# Patient Record
Sex: Female | Born: 1955 | Race: White | Hispanic: No | Marital: Married | Smoking: Never smoker
Health system: Southern US, Community
[De-identification: ages and names within clinical notes are randomized; demographics above are authoritative.]

## PROBLEM LIST (undated history)

## (undated) ENCOUNTER — Ambulatory Visit: Discharge status: Absent without leave | Payer: BLUE CROSS/BLUE SHIELD

## (undated) DIAGNOSIS — F329 Major depressive disorder, single episode, unspecified: Secondary | ICD-10-CM

## (undated) DIAGNOSIS — E079 Disorder of thyroid, unspecified: Secondary | ICD-10-CM

## (undated) DIAGNOSIS — I10 Essential (primary) hypertension: Secondary | ICD-10-CM

## (undated) DIAGNOSIS — F32A Depression, unspecified: Secondary | ICD-10-CM

## (undated) HISTORY — PX: CHOLECYSTECTOMY: SHX55

---

## 2002-08-05 ENCOUNTER — Ambulatory Visit (HOSPITAL_BASED_OUTPATIENT_CLINIC_OR_DEPARTMENT_OTHER): Admission: RE | Admit: 2002-08-05 | Discharge: 2002-08-05 | Payer: Self-pay | Admitting: Neurology

## 2005-09-12 ENCOUNTER — Ambulatory Visit: Payer: Self-pay

## 2006-10-11 ENCOUNTER — Ambulatory Visit: Payer: Self-pay

## 2007-06-23 ENCOUNTER — Ambulatory Visit: Payer: Self-pay | Admitting: Family Medicine

## 2007-10-18 ENCOUNTER — Ambulatory Visit: Payer: Self-pay

## 2008-11-09 ENCOUNTER — Inpatient Hospital Stay: Payer: Self-pay | Admitting: Vascular Surgery

## 2008-11-20 ENCOUNTER — Other Ambulatory Visit: Payer: Self-pay | Admitting: Internal Medicine

## 2008-11-21 ENCOUNTER — Ambulatory Visit: Payer: Self-pay | Admitting: Vascular Surgery

## 2009-04-07 ENCOUNTER — Ambulatory Visit: Payer: Self-pay | Admitting: Internal Medicine

## 2010-01-16 IMAGING — CT CT ABD-PELV W/ CM
1 of 2 series · 14 of 32 positions shown, 18 images · non-contrast
Comparison: none

REASON FOR EXAM: [id number] PO contrast also  RUQ abd pain s/p lap chole
EVAL  retroperitoneal ...
COMMENTS:

[Series 2: abdomen · axial · 0.78mm/px · z∈[-541,-121]mm · 14 of 93 slices shown, 18 images]
[im 5/93  soft-tissue]
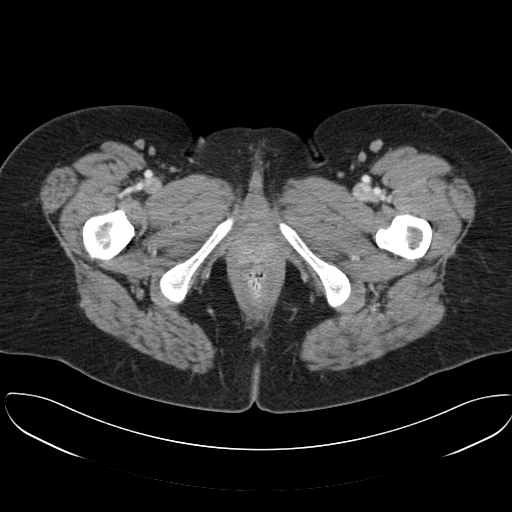
[im 5/93  bone]
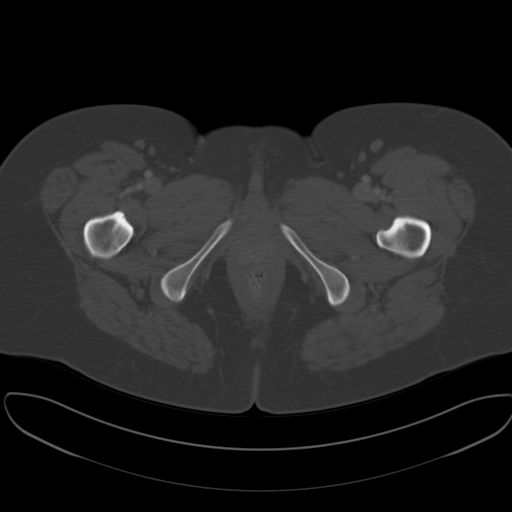
[im 13/93  soft-tissue]
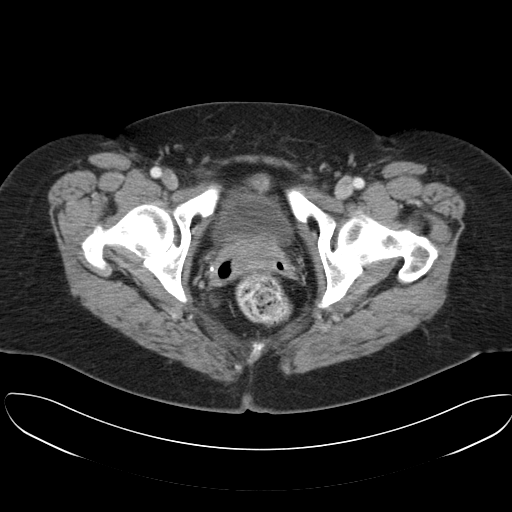
[im 21/93  soft-tissue]
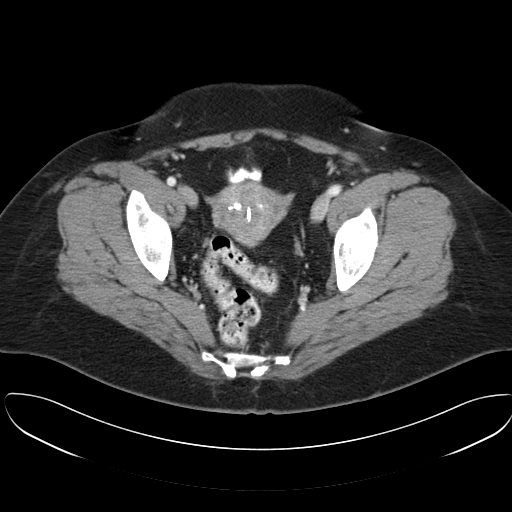
[im 29/93  soft-tissue]
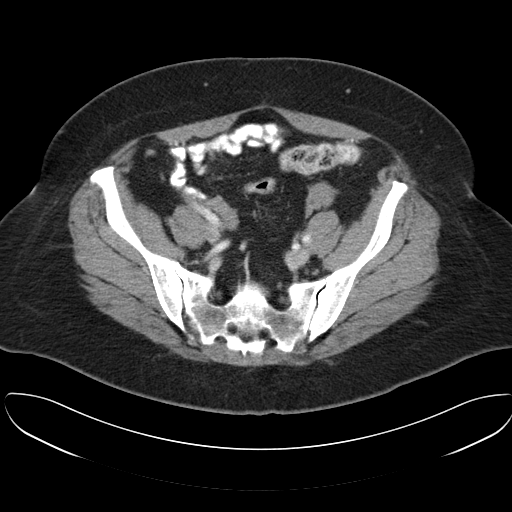
[im 37/93  soft-tissue]
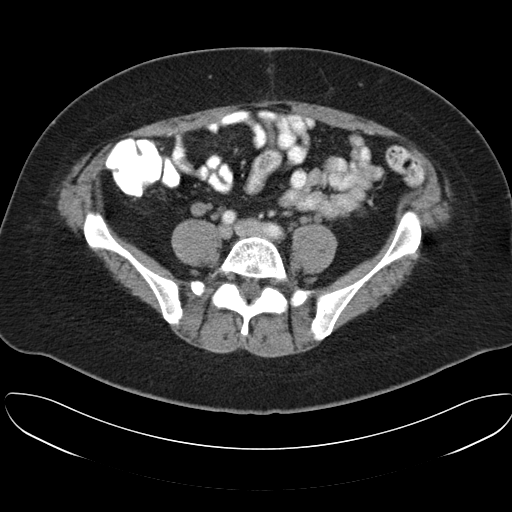
[im 45/93  soft-tissue]
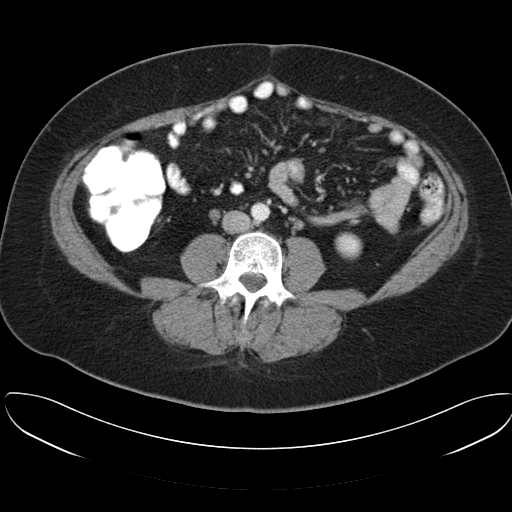
[im 49/93  soft-tissue]
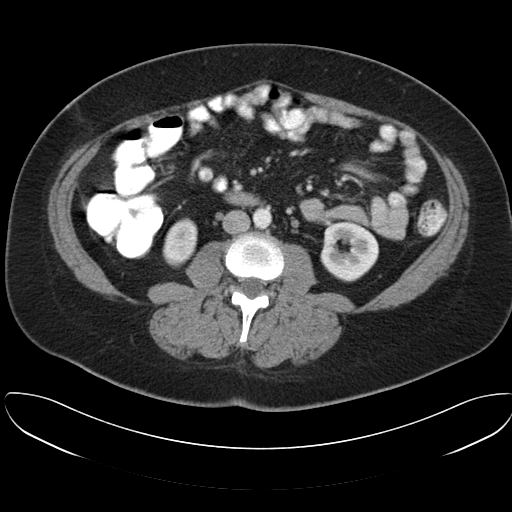
[im 57/93  soft-tissue]
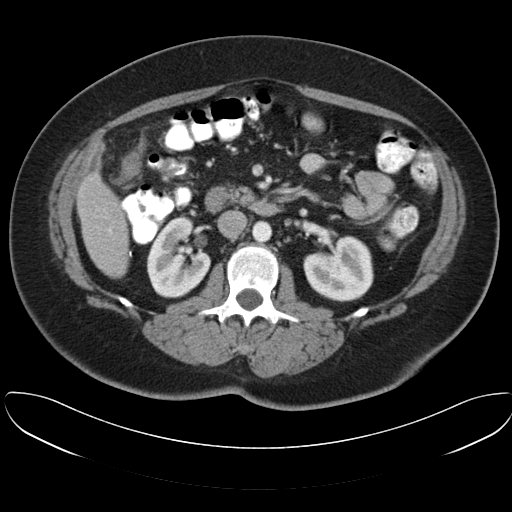
[im 65/93  soft-tissue]
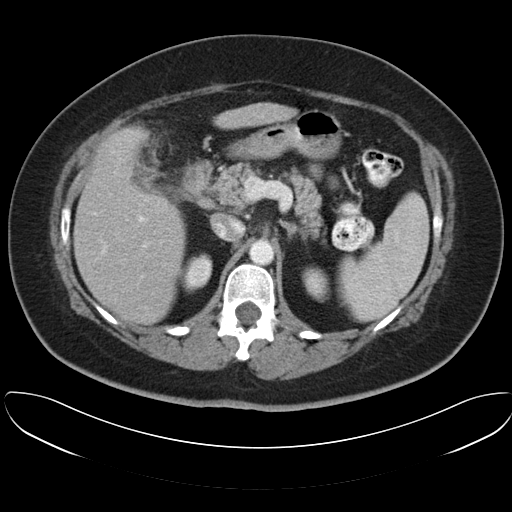
[im 65/93  bone]
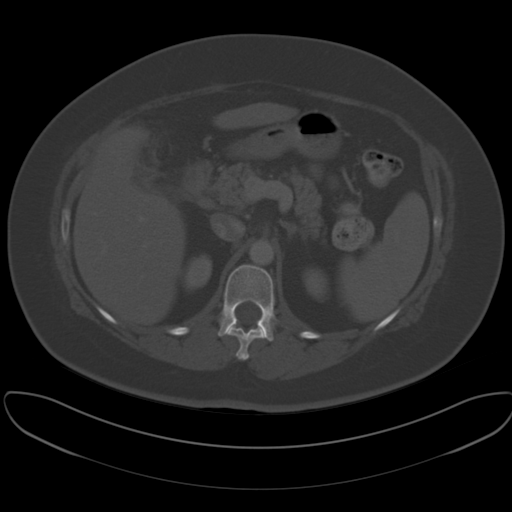
[im 73/93  soft-tissue]
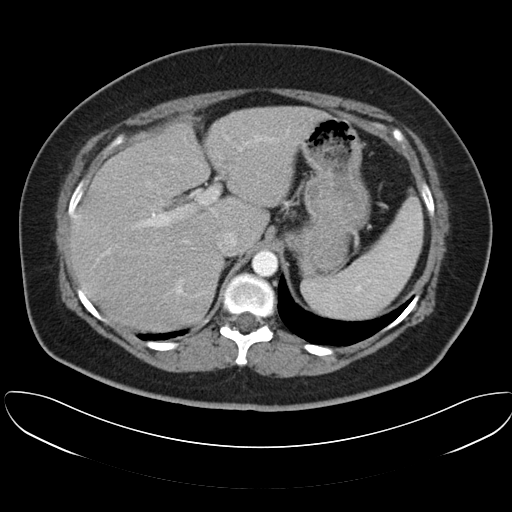
[im 77/93  lung]
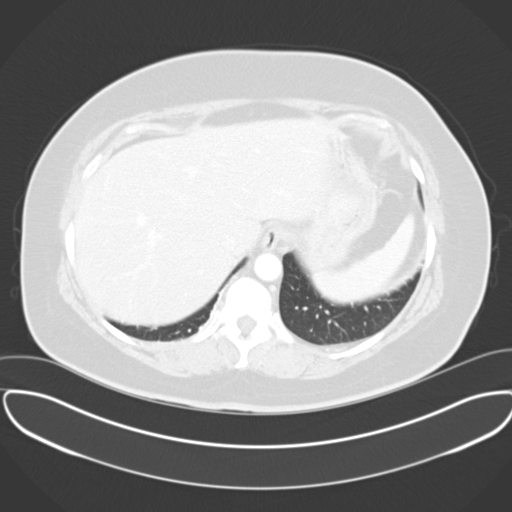
[im 81/93  soft-tissue]
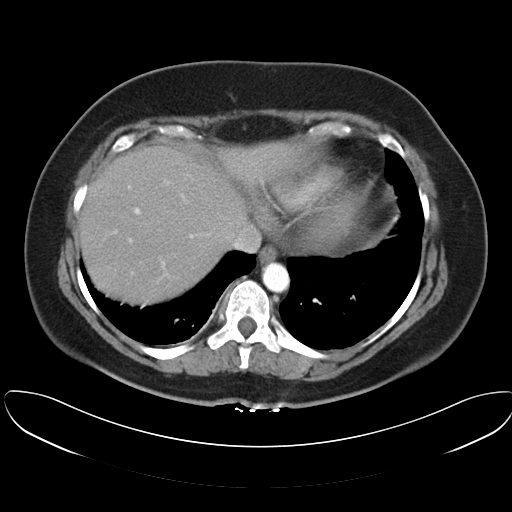
[im 81/93  lung]
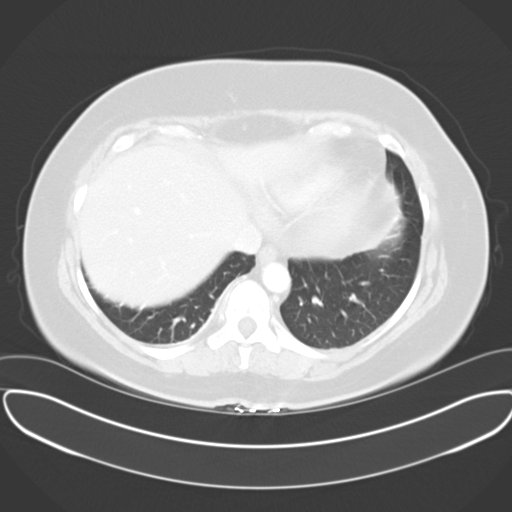
[im 85/93  lung]
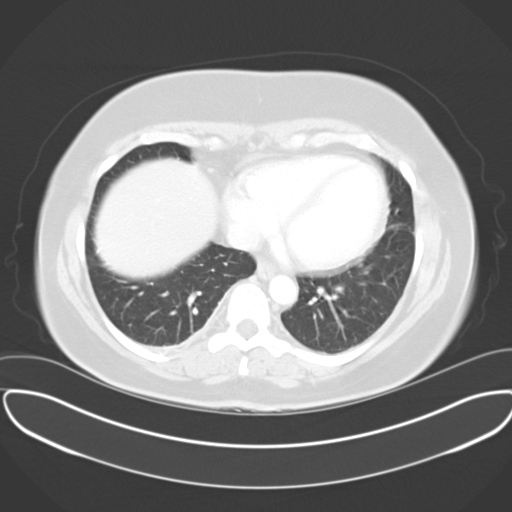
[im 89/93  soft-tissue]
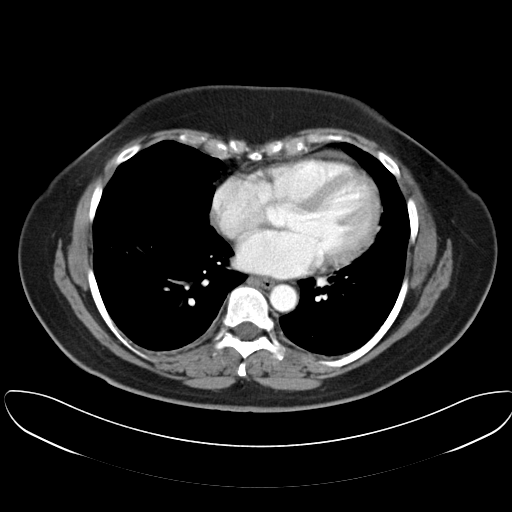
[im 89/93  lung]
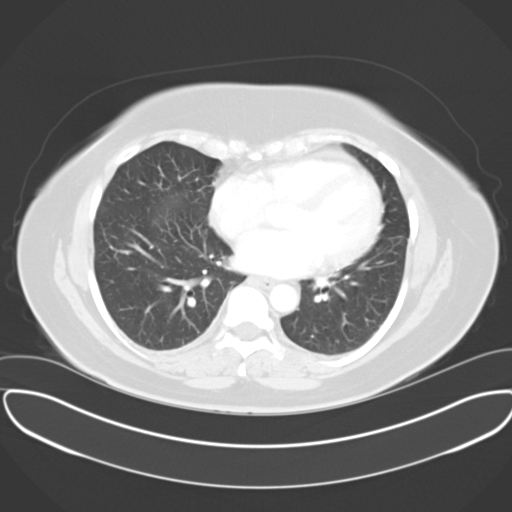

[14 of 32 positions shown; findings below may reference images not displayed]

PROCEDURE:     CT  - CT ABDOMEN / PELVIS  W  - November 21, 2008  [DATE]

RESULT:        Helical 5 mm sections were obtained from the lung bases
through the pubic symphysis.  The patient is status post intravenous
administration of 100 mL [id number] and oral contrast. Evaluation of the
lung bases demonstrates no gross abnormalities. Evaluation of the liver
demonstrates no evidence of abnormal parenchymal enhancement or enhancing
masses. Surgical clips are identified within the gallbladder fossa
consistent with the patient's history of a prior laparoscopically-guided
cholecystectomy. A fluid collection is identified within the gallbladder
fossa measuring 5.29 x 2.88 cm.  A small focus of air is appreciated along
the apex. This likely represents postsurgical change.  A second area of
fluid is identified just inferior to the gallbladder fossa inferior to the
tip of the liver. This has an amorphous appearance and likely represents
free fluid measuring 3.65 cm in diameter. No further regions of free fluid
or drainable loculated fluid collections are identified. There is no CT
evidence of a retroperitoneal hematoma. An intrauterine device is
appreciated within the endometrial canal. The kidneys, spleen, adrenals, and
pancreas are unremarkable. There is no CT evidence of an abdominal aortic
aneurysm. There is a small focus of air within the apex of the fluid
collection in the gallbladder fossa.  The patient has had a recent
cholecystectomy and this, too, likely represents postsurgical change. Follow
up surveillance evaluation recommended as the patient's signs and symptoms
dictate.
IMPRESSION: 1.     Fluid collection within the gallbladder fossa and along the inferior
tip of the liver. At this time there is no evidence of wall enhancement and
these may represent postsurgical change.
2.     No CT evidence of retroperitoneal hematoma.  No further abdominal
abnormalities are identified.
3.     These findings were discussed with Dr. Bella of the [facility] at the time of the initial interpretation.

## 2011-04-11 ENCOUNTER — Ambulatory Visit: Payer: Self-pay

## 2011-04-12 ENCOUNTER — Ambulatory Visit: Payer: Self-pay | Admitting: Internal Medicine

## 2012-06-06 IMAGING — US US EXTREM UP VENOUS*R*
1 series · 17 of 24 positions shown · non-contrast
Comparison: none

REASON FOR EXAM: STAT CR [id number] incr swelling pain redness  eval DVT
COMMENTS:

PROCEDURE:     US  - US DOPPLER UP EXTR RIGHT  - April 12, 2011  [DATE]
RESULT:     Comparison: None
TECHNIQUE: Multiple gray-scale, color-flow Doppler, and spectral waveform
tracings of the right internal jugular vein, brachiocephalic vein, and
proximal deep veins of the right upper extremity from the antecubital fossa
to the brachiocephalic vein are presented for review.

[Series 1: us extrem up venous*right* · 17 of 37 slices shown]
[im 1/37]
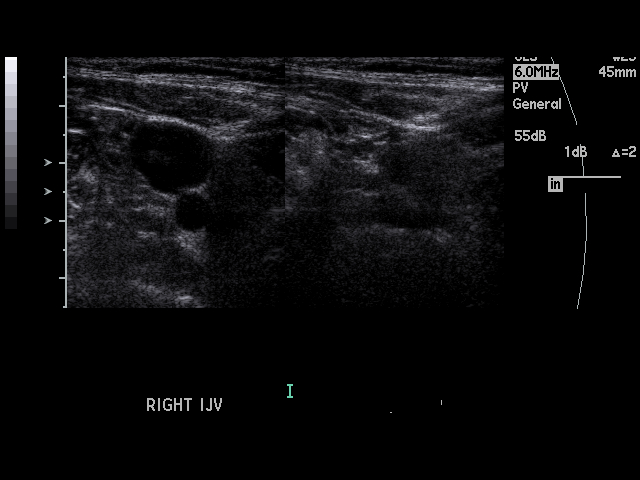
[im 4/37]
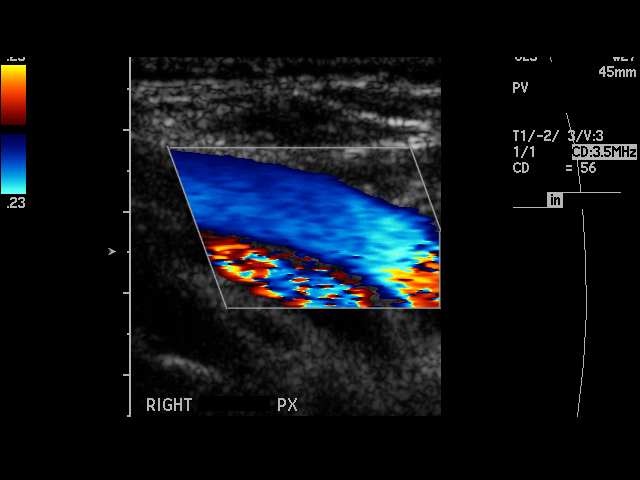
[im 5/37]
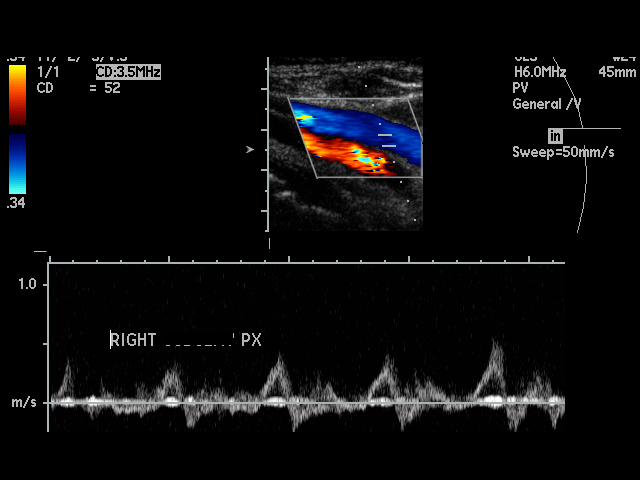
[im 7/37]
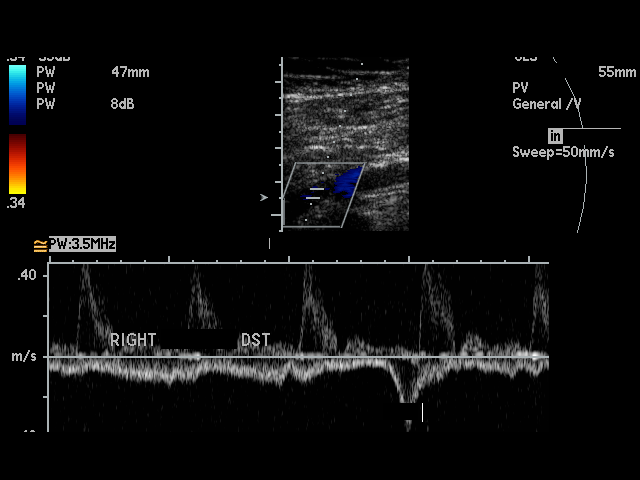
[im 10/37]
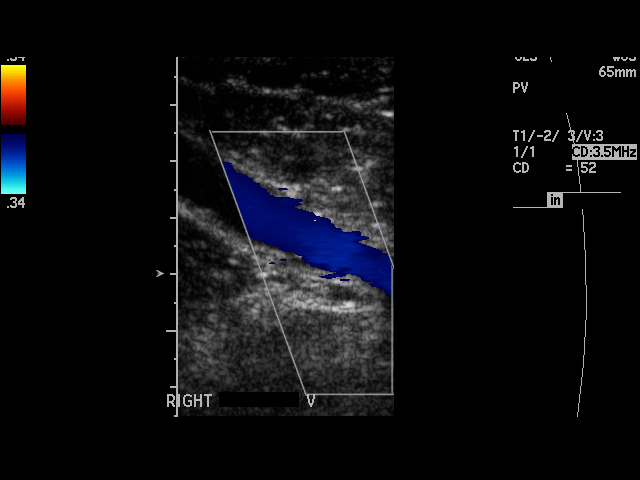
[im 11/37]
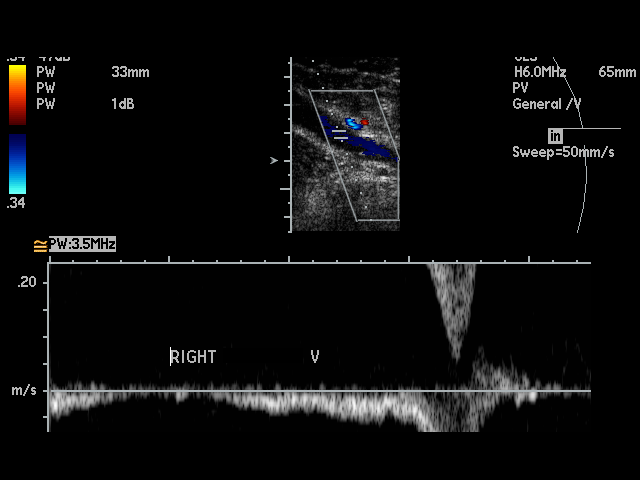
[im 15/37]
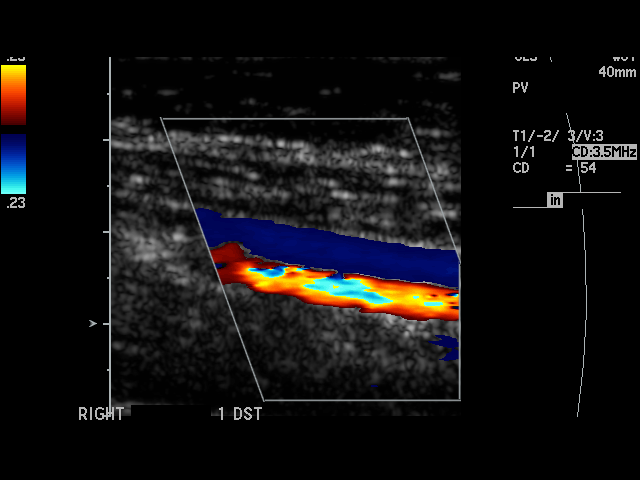
[im 16/37]
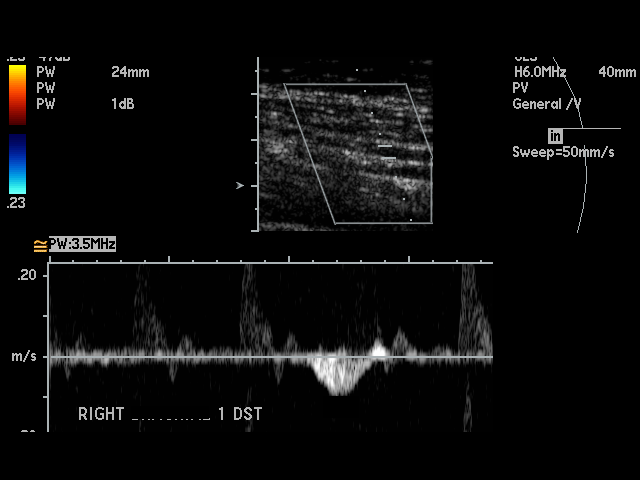
[im 19/37]
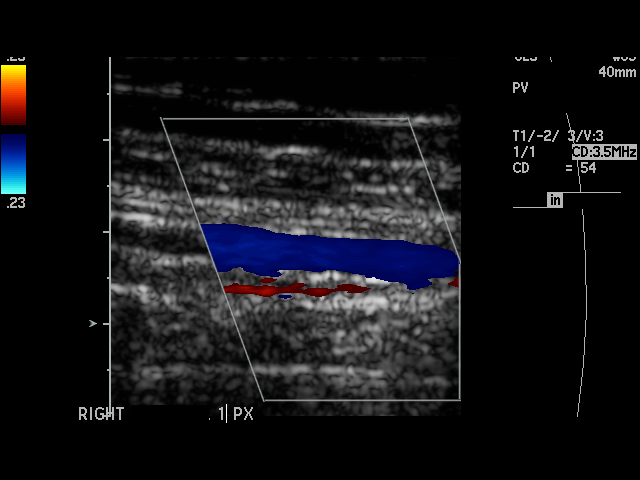
[im 21/37]
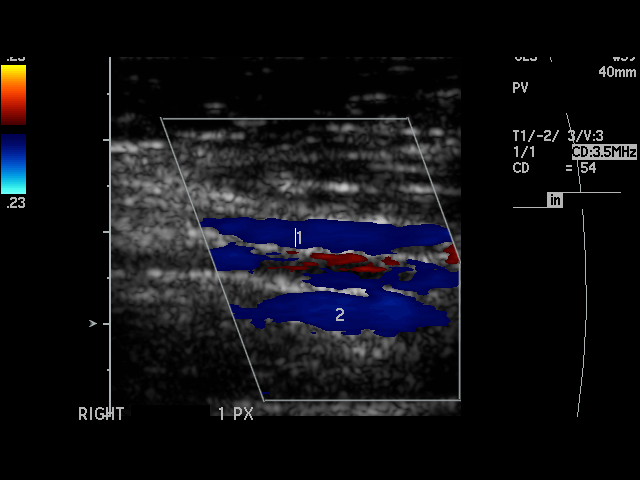
[im 22/37]
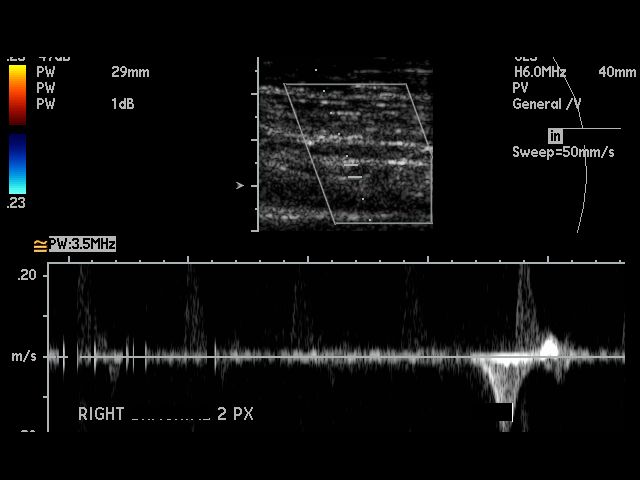
[im 26/37]
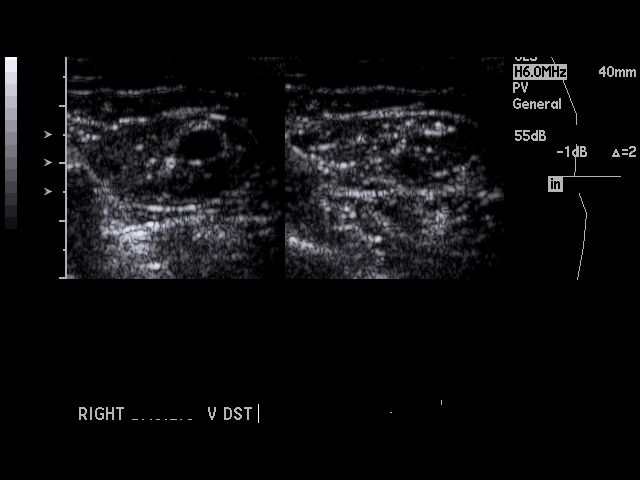
[im 27/37]
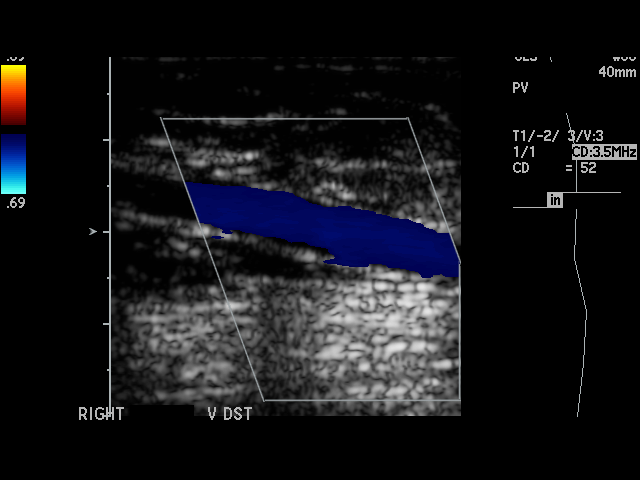
[im 30/37]
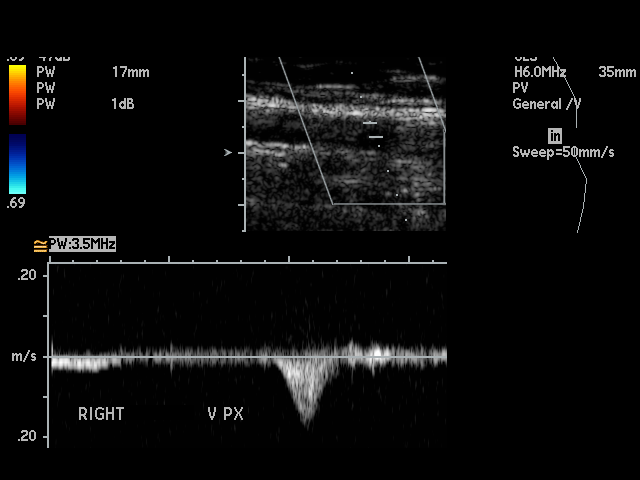
[im 32/37]
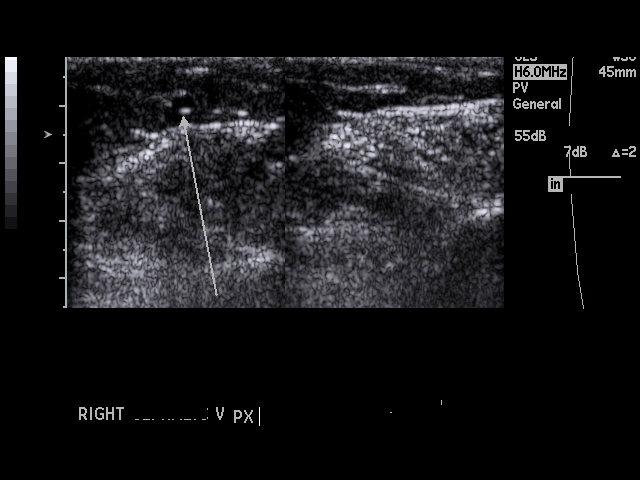
[im 33/37]
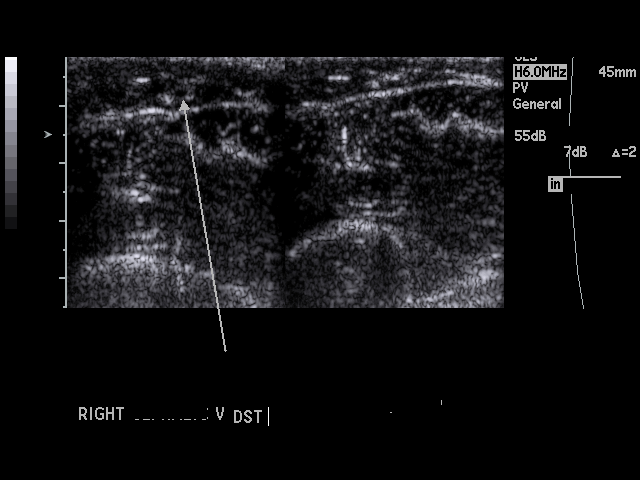
[im 37/37]
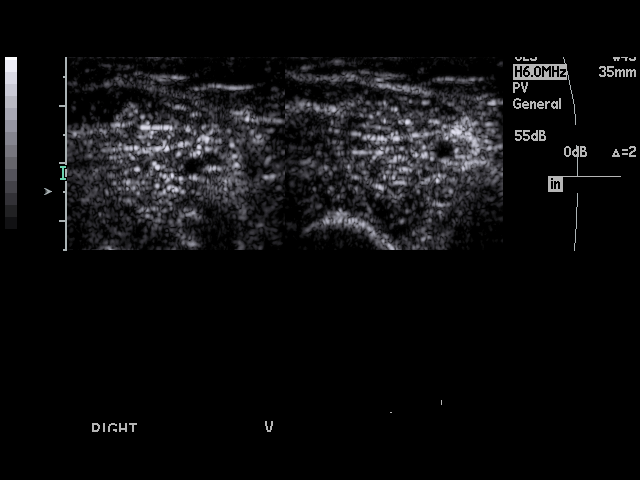

[17 of 24 positions shown; findings below may reference images not displayed]

FINDINGS: There is normal blood flow within the right internal jugular vein,
visualized portions of the brachiocephalic vein, and right upper extremity
deep venous system from the antecubital fossa to the brachiocephalic vein.
IMPRESSION: No sonographic evidence of deep venous thrombosis in the right upper
extremity.

## 2016-06-06 ENCOUNTER — Ambulatory Visit
Admission: EM | Admit: 2016-06-06 | Discharge: 2016-06-06 | Disposition: A | Discharge status: Home / Self Care | Payer: BLUE CROSS/BLUE SHIELD | Attending: Family Medicine | Admitting: Family Medicine

## 2016-06-06 ENCOUNTER — Encounter: Payer: Self-pay | Admitting: *Deleted

## 2016-06-06 DIAGNOSIS — J0100 Acute maxillary sinusitis, unspecified: Secondary | ICD-10-CM | POA: Diagnosis not present

## 2016-06-06 DIAGNOSIS — J0110 Acute frontal sinusitis, unspecified: Secondary | ICD-10-CM | POA: Diagnosis not present

## 2016-06-06 HISTORY — DX: Essential (primary) hypertension: I10

## 2016-06-06 HISTORY — DX: Disorder of thyroid, unspecified: E07.9

## 2016-06-06 MED ORDER — GUAIFENESIN-CODEINE 100-10 MG/5ML PO SOLN: 10.0000 mL | Freq: Four times a day (QID) | ORAL | 0 refills | Status: DC | PRN

## 2016-06-06 MED ORDER — AZITHROMYCIN 250 MG PO TABS: ORAL_TABLET | ORAL | 0 refills | Status: DC

## 2016-06-06 NOTE — ED Triage Notes (Signed)
Productive cough- green, nasal discharge- green, fever, chest and head congestion, sore throat, since Friday.

## 2016-06-06 NOTE — ED Provider Notes (Signed)
[facility]    CSN: [id number] Arrival date & time: 06/06/16  1223     History   Chief Complaint Chief Complaint  Patient presents with  . Fever  . Sore Throat  . Nasal Congestion  . Cough    HPI Stacey Frazier is a 61 y.o. female.    Fever  Associated symptoms: congestion, cough and headaches   Sore Throat  Associated symptoms include headaches.  Cough  Associated symptoms: fever and headaches   Associated symptoms: no wheezing   URI  Presenting symptoms: congestion, cough, facial pain and fever   Severity:  Moderate Onset quality:  Sudden Duration:  1 week Timing:  Constant Progression:  Worsening Chronicity:  New Relieved by:  Nothing Worsened by:  Nothing Ineffective treatments:  OTC medications Associated symptoms: headaches and sinus pain   Associated symptoms: no wheezing   Risk factors: sick contacts   Risk factors: not elderly, no chronic cardiac disease, no chronic kidney disease, no chronic respiratory disease, no diabetes mellitus, no immunosuppression, no recent illness and no recent travel     Past Medical History:  Diagnosis Date  . Hypertension   . Thyroid disease     There are no active problems to display for this patient.   Past Surgical History:  Procedure Laterality Date  . CHOLECYSTECTOMY      OB History    No data available       Home Medications    Prior to Admission medications   Medication Sig Start Date End Date Taking? Authorizing Provider  levothyroxine (SYNTHROID, LEVOTHROID) 25 MCG tablet Take 25 mcg by mouth daily before breakfast.   Yes Historical Provider, MD  lisinopril-hydrochlorothiazide (PRINZIDE,ZESTORETIC) 20-25 MG tablet Take 1 tablet by mouth daily.   Yes Historical Provider, MD  progesterone (PROMETRIUM) 100 MG capsule Take 100 mg by mouth daily.   Yes Historical Provider, MD  azithromycin (ZITHROMAX Z-PAK) 250 MG tablet 2 tabs po once day 1, then 1 tab po qd for next 4 days 06/06/16   Payton Mccallum, MD  guaiFENesin-codeine 100-10 MG/5ML syrup Take 10 mLs by mouth every 6 (six) hours as needed for cough. 06/06/16   Payton Mccallum, MD    Family History History reviewed. No pertinent family history.  Social History Social History  Substance Use Topics  . Smoking status: Never Smoker  . Smokeless tobacco: Never Used  . Alcohol use No     Allergies   Amoxicillin; Biaxin [clarithromycin]; and Sulfa antibiotics   Review of Systems Review of Systems  Constitutional: Positive for fever.  HENT: Positive for congestion and sinus pain.   Respiratory: Positive for cough. Negative for wheezing.   Neurological: Positive for headaches.     Physical Exam Triage Vital Signs ED Triage Vitals  Enc Vitals Group     BP 06/06/16 1258 117/65     Pulse Rate 06/06/16 1258 85     Resp 06/06/16 1258 16     Temp 06/06/16 1258 98.6 F (37 C)     Temp Source 06/06/16 1258 Oral     SpO2 06/06/16 1258 98 %     Weight 06/06/16 1300 200 lb (90.7 kg)     Height 06/06/16 1300 5\' 3"  (1.6 m)     Head Circumference --      Peak Flow --      Pain Score --      Pain Loc --      Pain Edu? --  Excl. in GC? --    No data found.   Updated Vital Signs BP 117/65 (BP Location: Left Arm)   Pulse 85   Temp 98.6 F (37 C) (Oral)   Resp 16   Ht 5\' 3"  (1.6 m)   Wt 200 lb (90.7 kg)   SpO2 98%   BMI 35.43 kg/m   Visual Acuity Right Eye Distance:   Left Eye Distance:   Bilateral Distance:    Right Eye Near:   Left Eye Near:    Bilateral Near:     Physical Exam  Constitutional: She appears well-developed and well-nourished. No distress.  HENT:  Head: Normocephalic and atraumatic.  Right Ear: Tympanic membrane, external ear and ear canal normal.  Left Ear: Tympanic membrane, external ear and ear canal normal.  Nose: Mucosal edema and rhinorrhea present. No nose lacerations, sinus tenderness, nasal deformity, septal deviation or nasal septal hematoma. No epistaxis.  No foreign bodies.  Right sinus exhibits maxillary sinus tenderness and frontal sinus tenderness. Left sinus exhibits maxillary sinus tenderness and frontal sinus tenderness.  Mouth/Throat: Uvula is midline, oropharynx is clear and moist and mucous membranes are normal. No oropharyngeal exudate.  Eyes: Conjunctivae and EOM are normal. Pupils are equal, round, and reactive to light. Right eye exhibits no discharge. Left eye exhibits no discharge. No scleral icterus.  Neck: Normal range of motion. Neck supple. No thyromegaly present.  Cardiovascular: Normal rate, regular rhythm and normal heart sounds.   Pulmonary/Chest: Effort normal and breath sounds normal. No respiratory distress. She has no wheezes. She has no rales.  Lymphadenopathy:    She has no cervical adenopathy.  Skin: She is not diaphoretic.  Nursing note and vitals reviewed.    UC Treatments / Results  Labs (all labs ordered are listed, but only abnormal results are displayed) Labs Reviewed - No data to display  EKG  EKG Interpretation None       Radiology No results found.  Procedures Procedures (including critical care time)  Medications Ordered in UC Medications - No data to display   Initial Impression / Assessment and Plan / UC Course  I have reviewed the triage vital signs and the nursing notes.  Pertinent labs & imaging results that were available during my care of the patient were reviewed by me and considered in my medical decision making (see chart for details).       Final Clinical Impressions(s) / UC Diagnoses   Final diagnoses:  Acute maxillary sinusitis, recurrence not specified  Acute frontal sinusitis, recurrence not specified    New Prescriptions Discharge Medication List as of 06/06/2016  2:28 PM    START taking these medications   Details  azithromycin (ZITHROMAX Z-PAK) 250 MG tablet 2 tabs po once day 1, then 1 tab po qd for next 4 days, Normal    guaiFENesin-codeine 100-10 MG/5ML syrup Take 10 mLs  by mouth every 6 (six) hours as needed for cough., Starting Mon 06/06/2016, Print       1.  diagnosis reviewed with patient 2. rx as per orders above; reviewed possible side effects, interactions, risks and benefits  3. Follow-up prn if symptoms worsen or don't improve   Payton Mccallumrlando Nikeya Maxim, MD 06/06/16 1432

## 2017-10-25 ENCOUNTER — Other Ambulatory Visit: Payer: Self-pay

## 2017-10-25 ENCOUNTER — Encounter: Payer: Self-pay | Admitting: Emergency Medicine

## 2017-10-25 ENCOUNTER — Ambulatory Visit
Admission: EM | Admit: 2017-10-25 | Discharge: 2017-10-25 | Disposition: A | Discharge status: Home / Self Care | Payer: BLUE CROSS/BLUE SHIELD | Attending: Emergency Medicine | Admitting: Emergency Medicine

## 2017-10-25 DIAGNOSIS — S86912A Strain of unspecified muscle(s) and tendon(s) at lower leg level, left leg, initial encounter: Secondary | ICD-10-CM

## 2017-10-25 HISTORY — DX: Major depressive disorder, single episode, unspecified: F32.9

## 2017-10-25 HISTORY — DX: Depression, unspecified: F32.A

## 2017-10-25 MED ORDER — NAPROXEN 500 MG PO TABS: 500.0000 mg | ORAL_TABLET | Freq: Two times a day (BID) | ORAL | 0 refills | Status: AC | PRN

## 2017-10-25 NOTE — Discharge Instructions (Signed)
Recommend start Naproxen 500mg  twice as day as directed for pain and swelling. Use knee brace to help with support. Elevated knee and leg as much as possible. May apply ice to help with swelling and alternate with heat for comfort. Recommend follow-up with an Orthopedic in 2 weeks if not improving.

## 2017-10-25 NOTE — ED Triage Notes (Signed)
Patient c/o walking down the steps 2 days ago and she heard a pop in the left knee. Patient Now has left lower leg pain. She has taken Ibuprofen with some relief.

## 2017-10-26 NOTE — ED Provider Notes (Signed)
[facility]    CSN: [id number] Arrival date & time: 10/25/17  1053     History   Chief Complaint Chief Complaint  Patient presents with  . Leg Pain    HPI Stacey Frazier is a 62 y.o. female.   62 year old female presents with left leg and knee pain that started 2 days ago. She was walking down steps and when she placed her left foot on the landing, she felt and heard a "pop" in the back of her knee. She was still able to walk and bear weight but now the muscles just above and below her knee ache. She is concerned she may have torn a ligament or tendon. She has taken Ibuprofen with some relief. She denies any significant pain in the knee now and denies any numbness or change in sensation. No previous injury to her knee or leg. Other chronic health issues include HTN, thyroid disease, and depression and she takes Prinzide, Synthroid, Lexapro, Wellbutrin, Abilify and Progesterone daily.    The history is provided by the patient.    Past Medical History:  Diagnosis Date  . Depression   . Hypertension   . Thyroid disease     There are no active problems to display for this patient.   Past Surgical History:  Procedure Laterality Date  . CHOLECYSTECTOMY      OB History   None      Home Medications    Prior to Admission medications   Medication Sig Start Date End Date Taking? Authorizing Provider  ARIPiprazole (ABILIFY) 2 MG tablet Take 2 mg by mouth daily.   Yes [provider]  buPROPion (WELLBUTRIN SR) 150 MG 12 hr tablet Take 150 mg by mouth 2 (two) times daily.   Yes [provider]  escitalopram (LEXAPRO) 20 MG tablet Take 20 mg by mouth daily.   Yes [provider]  levothyroxine (SYNTHROID, LEVOTHROID) 25 MCG tablet Take 25 mcg by mouth daily before breakfast.   Yes [provider]  lisinopril-hydrochlorothiazide (PRINZIDE,ZESTORETIC) 20-25 MG tablet Take 1 tablet by mouth daily.   Yes [provider]    progesterone (PROMETRIUM) 100 MG capsule Take 100 mg by mouth daily.   Yes [provider]  naproxen (NAPROSYN) 500 MG tablet Take 1 tablet (500 mg total) by mouth 2 (two) times daily as needed for moderate pain. 10/25/17   Sudie Grumbling, NP    Family History Family History  Problem Relation Age of Onset  . Rheum arthritis Mother     Social History Social History   Tobacco Use  . Smoking status: Never Smoker  . Smokeless tobacco: Never Used  Substance Use Topics  . Alcohol use: No  . Drug use: Never     Allergies   Amoxicillin; Biaxin [clarithromycin]; and Sulfa antibiotics   Review of Systems Review of Systems  Constitutional: Negative for activity change, appetite change, chills, fatigue and fever.  Respiratory: Negative for cough, chest tightness, shortness of breath and wheezing.   Cardiovascular: Negative for chest pain, palpitations and leg swelling.  Gastrointestinal: Negative for nausea and vomiting.  Musculoskeletal: Positive for arthralgias, joint swelling and myalgias. Negative for gait problem.  Skin: Negative for color change, rash and wound.  Neurological: Negative for dizziness, tremors, seizures, syncope, weakness, light-headedness, numbness and headaches.  Hematological: Negative for adenopathy. Does not bruise/bleed easily.     Physical Exam Triage Vital Signs ED Triage Vitals  Enc Vitals Group     BP  10/25/17 1117 107/69     Pulse Rate 10/25/17 1117 80     Resp 10/25/17 1117 18     Temp 10/25/17 1117 98.2 F (36.8 C)     Temp Source 10/25/17 1117 Oral     SpO2 10/25/17 1117 97 %     Weight 10/25/17 1113 225 lb (102.1 kg)     Height 10/25/17 1113 5\' 3"  (1.6 m)     Head Circumference --      Peak Flow --      Pain Score 10/25/17 1113 5     Pain Loc --      Pain Edu? --      Excl. in GC? --    No data found.  Updated Vital Signs BP 107/69 (BP Location: Right Arm)   Pulse 80   Temp 98.2 F (36.8 C) (Oral)   Resp 18   Ht 5'  3" (1.6 m)   Wt 225 lb (102.1 kg)   SpO2 97%   BMI 39.86 kg/m   Visual Acuity Right Eye Distance:   Left Eye Distance:   Bilateral Distance:    Right Eye Near:   Left Eye Near:    Bilateral Near:     Physical Exam  Constitutional: She is oriented to person, place, and time. Vital signs are normal. She appears well-developed and well-nourished. She is cooperative. No distress.  Patient sitting in exam chair in no acute distress. Changes positions slowly but with minimal pain.   HENT:  Head: Normocephalic and atraumatic.  Eyes: Conjunctivae and EOM are normal.  Neck: Normal range of motion.  Cardiovascular: Normal rate.  Pulmonary/Chest: Effort normal.  Musculoskeletal: Normal range of motion. She exhibits tenderness.       Left knee: She exhibits swelling. She exhibits normal range of motion, no effusion, no ecchymosis, no deformity, no laceration, no erythema, normal alignment, no LCL laxity and normal patellar mobility. Tenderness found.       Legs: Has full range of motion of knee with minimal pain. Slightly tender along posterior crucial ligament. Mostly tender at insertion point of lower hamstring and gastronemius muscles. No distinct swelling of muscles, no redness or firmness noted. Slight swelling of anterior aspect of knee. Good distal pulses. No neuro deficits noted.   Neurological: She is alert and oriented to person, place, and time. She has normal strength and normal reflexes. No sensory deficit. Coordination and gait normal.  Skin: Skin is warm, dry and intact. Capillary refill takes less than 2 seconds. No bruising, no ecchymosis and no petechiae noted. No erythema.  Psychiatric: Her speech is normal and behavior is normal. Thought content normal. Her mood appears anxious.  Vitals reviewed.    UC Treatments / Results  Labs (all labs ordered are listed, but only abnormal results are displayed) Labs Reviewed - No data to display  EKG None  Radiology No results  found.  Procedures Procedures (including critical care time)  Medications Ordered in UC Medications - No data to display  Initial Impression / Assessment and Plan / UC Course  I have reviewed the triage vital signs and the nursing notes.  Pertinent labs & imaging results that were available during my care of the patient were reviewed by me and considered in my medical decision making (see chart for details).    Reviewed with patient that she appears to have a muscle strain. Since she is fully mobile, doubt ligament or tendon tear. Do not feel imaging is needed at this time.  Recommend take Naproxen 500mg  twice a day as directed for pain. Elevate knee and leg as much as possible. Wear knee brace for support during the day- may remove at night. May apply ice to help with swelling and alternate with warm compresses to area for comfort. Follow-up with an Orthopedic in 2 weeks if not improving.  Final Clinical Impressions(s) / UC Diagnoses   Final diagnoses:  Knee strain, left, initial encounter     Discharge Instructions     Recommend start Naproxen 500mg  twice as day as directed for pain and swelling. Use knee brace to help with support. Elevated knee and leg as much as possible. May apply ice to help with swelling and alternate with heat for comfort. Recommend follow-up with an Orthopedic in 2 weeks if not improving.     ED Prescriptions    Medication Sig Dispense Auth. Provider   naproxen (NAPROSYN) 500 MG tablet Take 1 tablet (500 mg total) by mouth 2 (two) times daily as needed for moderate pain. 20 tablet Sudie Grumbling, NP     Controlled Substance Prescriptions  Controlled Substance Registry consulted? Not Applicable   Sudie Grumbling, NP 10/26/17 [phone]

## 2023-07-26 ENCOUNTER — Encounter: Payer: Self-pay | Admitting: Emergency Medicine

## 2023-07-26 ENCOUNTER — Ambulatory Visit
Admission: EM | Admit: 2023-07-26 | Discharge: 2023-07-26 | Disposition: A | Discharge status: Home / Self Care | Payer: Self-pay | Attending: Emergency Medicine | Admitting: Emergency Medicine

## 2023-07-26 DIAGNOSIS — I10 Essential (primary) hypertension: Secondary | ICD-10-CM | POA: Insufficient documentation

## 2023-07-26 LAB — BASIC METABOLIC PANEL
Anion gap: 9 (ref 5–15)
BUN: 16 mg/dL (ref 8–23)
CO2: 25 mmol/L (ref 22–32)
Calcium: 9.1 mg/dL (ref 8.9–10.3)
Chloride: 105 mmol/L (ref 98–111)
Creatinine, Ser: 0.8 mg/dL (ref 0.44–1.00)
GFR, Estimated: >60 mL/min (ref >60)
Glucose, Bld: 106 mg/dL — ABNORMAL HIGH (ref 70–99)
Potassium: 3.6 mmol/L (ref 3.5–5.1)
Sodium: 139 mmol/L (ref 135–145)

## 2023-07-26 MED ORDER — LISINOPRIL-HYDROCHLOROTHIAZIDE 20-25 MG PO TABS: 1.0000 | ORAL_TABLET | Freq: Every day | ORAL | 1 refills | Status: AC

## 2023-07-26 NOTE — ED Provider Notes (Signed)
 [facility]    CSN: [id number] Arrival date & time: 07/26/23  1459      History   Chief Complaint Chief Complaint  Patient presents with   Headache    HPI Stacey Frazier is a 68 y.o. female.   HPI  68 year old female with past medical history significant for thyroid disease, hypertension, depression presents for evaluation of headache and elevated blood pressure for the last 2 weeks.  She reports that she has been out of her blood pressure medicine for approximately 2 months.  She has had mostly a frontal headache but on Friday she developed headache in the occiput.  She reports that her headache is still present but it is mild.  She denies any change in vision, nausea or vomiting, numbness, tingling, weakness in any of her extremities.  Past Medical History:  Diagnosis Date   Depression    Hypertension    Thyroid disease     There are no active problems to display for this patient.   Past Surgical History:  Procedure Laterality Date   CHOLECYSTECTOMY      OB History   No obstetric history on file.      Home Medications    Prior to Admission medications   Medication Sig Start Date End Date Taking? Authorizing Provider  ARIPiprazole (ABILIFY) 2 MG tablet Take 2 mg by mouth daily.    [provider]  buPROPion (WELLBUTRIN SR) 150 MG 12 hr tablet Take 150 mg by mouth 2 (two) times daily.    [provider]  escitalopram (LEXAPRO) 20 MG tablet Take 20 mg by mouth daily.    [provider]  levothyroxine (SYNTHROID, LEVOTHROID) 25 MCG tablet Take 25 mcg by mouth daily before breakfast.    [provider]  lisinopril-hydrochlorothiazide (ZESTORETIC) 20-25 MG tablet Take 1 tablet by mouth daily. 07/26/23   Becky Augusta, NP  naproxen (NAPROSYN) 500 MG tablet Take 1 tablet (500 mg total) by mouth 2 (two) times daily as needed for moderate pain. 10/25/17   Sudie Grumbling, NP  progesterone (PROMETRIUM) 100 MG capsule Take 100  mg by mouth daily.    [provider]    Family History Family History  Problem Relation Age of Onset   Rheum arthritis Mother     Social History Social History   Tobacco Use   Smoking status: Never   Smokeless tobacco: Never  Substance Use Topics   Alcohol use: No   Drug use: Never     Allergies   Amoxicillin, Biaxin [clarithromycin], and Sulfa antibiotics   Review of Systems Review of Systems  Eyes:  Negative for visual disturbance.  Respiratory:  Negative for shortness of breath.   Cardiovascular:  Negative for chest pain.  Neurological:  Positive for headaches. Negative for dizziness.     Physical Exam Triage Vital Signs ED Triage Vitals  Encounter Vitals Group     BP      Systolic BP Percentile      Diastolic BP Percentile      Pulse      Resp      Temp      Temp src      SpO2      Weight      Height      Head Circumference      Peak Flow      Pain Score      Pain Loc      Pain Education  Exclude from Growth Chart    No data found.  Updated Vital Signs BP (!) 154/89 (BP Location: Left Arm)   Pulse 71   Temp 98 F (36.7 C) (Oral)   Resp 19   SpO2 100%   Visual Acuity Right Eye Distance:   Left Eye Distance:   Bilateral Distance:    Right Eye Near:   Left Eye Near:    Bilateral Near:     Physical Exam Vitals and nursing note reviewed.  Constitutional:      Appearance: Normal appearance. She is not ill-appearing.  HENT:     Head: Normocephalic and atraumatic.  Eyes:     General: No scleral icterus.    Extraocular Movements: Extraocular movements intact.     Conjunctiva/sclera: Conjunctivae normal.     Pupils: Pupils are equal, round, and reactive to light.  Cardiovascular:     Rate and Rhythm: Normal rate and regular rhythm.     Pulses: Normal pulses.     Heart sounds: Normal heart sounds. No murmur heard.    No friction rub. No gallop.  Pulmonary:     Effort: Pulmonary effort is normal.     Breath sounds:  Normal breath sounds. No wheezing, rhonchi or rales.  Skin:    General: Skin is warm and dry.     Capillary Refill: Capillary refill takes less than 2 seconds.     Findings: No erythema or rash.  Neurological:     General: No focal deficit present.     Mental Status: She is alert and oriented to person, place, and time.     Cranial Nerves: No cranial nerve deficit.     Sensory: No sensory deficit.     Motor: No weakness.     Coordination: Coordination normal.     Gait: Gait normal.     Deep Tendon Reflexes: Reflexes normal.      UC Treatments / Results  Labs (all labs ordered are listed, but only abnormal results are displayed) Labs Reviewed  BASIC METABOLIC PANEL - Abnormal; Notable for the following components:      Result Value   Glucose, Bld 106 (*)    All other components within normal limits    EKG Normal sinus rhythm with ventricular to 64 bpm .  152 ms Castration 74 ms QT/QTc 384/306 ms Possible left atrial enlargement left ventricular hypertrophy.  Nonspecific ST and T wave abnormalities.   Radiology No results found.  Procedures Procedures (including critical care time)  Medications Ordered in UC Medications - No data to display  Initial Impression / Assessment and Plan / UC Course  I have reviewed the triage vital signs and the nursing notes.  Pertinent labs & imaging results that were available during my care of the patient were reviewed by me and considered in my medical decision making (see chart for details).   Patient is a pleasant, nontoxic-appearing 68 year old female presenting for evaluation of headache and elevated blood pressure as outlined HPI above.  She denies any change in vision, nausea or vomiting, chest pain, shortness of breath.  Her blood pressure is mildly elevated 154/89.  She reports that she has not been taking her lisinopril hydrochlorothiazide for the last 2 months.  In the exam room patient's cranial nerves II through XII are  intact.  Pupils equal and reactive and EOM is intact.  Bilateral grips, upper extremity strength, lower extremity strength are all 5/5.  DTRs are 2+ globally.  She has normal finger-to-nose, heel-to-shin, and negative  pronator drift.  Her EKG shows normal sinus rhythm with left atrial enlargement and left trickle hypertrophy.  Also none specific ST or T wave abnormalities.  No other recent tracings available in epic for comparison.  I will order a BMP to evaluate patient's renal function and if her renal function is normal I will restart her on her antihypertensive and have her follow-up with her PCP.  BMP shows normal renal function with a BUN of 16 troponin 0.80.  I will discharge patient with a diagnosis of hypertension and restart her on her lisinopril hydrochlorothiazide.  I will have her make a follow-up appointment with her PCP for ongoing medication management.   Final Clinical Impressions(s) / UC Diagnoses   Final diagnoses:  Hypertension, unspecified type     Discharge Instructions      Your kidney function was normal and your EKG was very reassuring.  I believe your headache is coming from your elevated blood pressure.  Resume taking your lisinopril hydrochlorothiazide as previously prescribed.  Purchase a blood pressure cuff, if you do not have one, and start taking her blood pressure sometimes a week to help judge whether not your blood pressure medication is effective.  Write the values down on the paper given in your discharge paperwork and take these with you to review with your doctor.  Make a follow-up appointment with your primary care provider to review your blood pressure readings as well as for ongoing medication management.     ED Prescriptions     Medication Sig Dispense Auth. Provider   lisinopril-hydrochlorothiazide (ZESTORETIC) 20-25 MG tablet Take 1 tablet by mouth daily. 30 tablet Becky Augusta, NP      PDMP not reviewed this encounter.   Becky Augusta,  NP 07/26/23 [phone]

## 2023-07-26 NOTE — Discharge Instructions (Addendum)
 Your kidney function was normal and your EKG was very reassuring.  I believe your headache is coming from your elevated blood pressure.  Resume taking your lisinopril hydrochlorothiazide as previously prescribed.  Purchase a blood pressure cuff, if you do not have one, and start taking her blood pressure sometimes a week to help judge whether not your blood pressure medication is effective.  Write the values down on the paper given in your discharge paperwork and take these with you to review with your doctor.  Make a follow-up appointment with your primary care provider to review your blood pressure readings as well as for ongoing medication management.

## 2023-07-26 NOTE — ED Triage Notes (Signed)
 Patient states that she has been out of her BP meds for a few months, Patient states that she has been experience headaches for the past 2 weeks. Patient states that she has a pcp that she has not contacted them. Patient has not checked her BP.
